# Patient Record
Sex: Male | Born: 1940 | Race: White | Hispanic: No | Marital: Married | Smoking: Former smoker
Health system: Southern US, Community
[De-identification: ages and names within clinical notes are randomized; demographics above are authoritative.]

## PROBLEM LIST (undated history)

## (undated) DIAGNOSIS — C801 Malignant (primary) neoplasm, unspecified: Secondary | ICD-10-CM

## (undated) DIAGNOSIS — E119 Type 2 diabetes mellitus without complications: Secondary | ICD-10-CM

## (undated) DIAGNOSIS — J449 Chronic obstructive pulmonary disease, unspecified: Secondary | ICD-10-CM

## (undated) DIAGNOSIS — I10 Essential (primary) hypertension: Secondary | ICD-10-CM

## (undated) DIAGNOSIS — N289 Disorder of kidney and ureter, unspecified: Secondary | ICD-10-CM

## (undated) DIAGNOSIS — I509 Heart failure, unspecified: Secondary | ICD-10-CM

## (undated) DIAGNOSIS — I4891 Unspecified atrial fibrillation: Secondary | ICD-10-CM

## (undated) DIAGNOSIS — I639 Cerebral infarction, unspecified: Secondary | ICD-10-CM

## (undated) HISTORY — PX: CORONARY ARTERY BYPASS GRAFT: SHX141

---

## 2021-11-13 ENCOUNTER — Emergency Department (HOSPITAL_BASED_OUTPATIENT_CLINIC_OR_DEPARTMENT_OTHER): Payer: Medicare HMO

## 2021-11-13 ENCOUNTER — Emergency Department (HOSPITAL_BASED_OUTPATIENT_CLINIC_OR_DEPARTMENT_OTHER)
Admission: EM | Admit: 2021-11-13 | Discharge: 2021-11-13 | Discharge status: Against Medical Advice | Payer: Medicare HMO | Attending: Emergency Medicine | Admitting: Emergency Medicine

## 2021-11-13 ENCOUNTER — Encounter (HOSPITAL_BASED_OUTPATIENT_CLINIC_OR_DEPARTMENT_OTHER): Payer: Self-pay | Admitting: Urology

## 2021-11-13 DIAGNOSIS — N189 Chronic kidney disease, unspecified: Secondary | ICD-10-CM | POA: Insufficient documentation

## 2021-11-13 DIAGNOSIS — S79922A Unspecified injury of left thigh, initial encounter: Secondary | ICD-10-CM | POA: Diagnosis present

## 2021-11-13 DIAGNOSIS — I509 Heart failure, unspecified: Secondary | ICD-10-CM | POA: Insufficient documentation

## 2021-11-13 DIAGNOSIS — I4891 Unspecified atrial fibrillation: Secondary | ICD-10-CM | POA: Diagnosis not present

## 2021-11-13 DIAGNOSIS — R944 Abnormal results of kidney function studies: Secondary | ICD-10-CM | POA: Insufficient documentation

## 2021-11-13 DIAGNOSIS — R000 Tachycardia, unspecified: Secondary | ICD-10-CM | POA: Insufficient documentation

## 2021-11-13 DIAGNOSIS — S301XXA Contusion of abdominal wall, initial encounter: Secondary | ICD-10-CM | POA: Insufficient documentation

## 2021-11-13 DIAGNOSIS — S7012XA Contusion of left thigh, initial encounter: Secondary | ICD-10-CM | POA: Diagnosis not present

## 2021-11-13 DIAGNOSIS — J449 Chronic obstructive pulmonary disease, unspecified: Secondary | ICD-10-CM | POA: Diagnosis not present

## 2021-11-13 DIAGNOSIS — I130 Hypertensive heart and chronic kidney disease with heart failure and stage 1 through stage 4 chronic kidney disease, or unspecified chronic kidney disease: Secondary | ICD-10-CM | POA: Insufficient documentation

## 2021-11-13 DIAGNOSIS — Y92002 Bathroom of unspecified non-institutional (private) residence single-family (private) house as the place of occurrence of the external cause: Secondary | ICD-10-CM | POA: Insufficient documentation

## 2021-11-13 DIAGNOSIS — Z8673 Personal history of transient ischemic attack (TIA), and cerebral infarction without residual deficits: Secondary | ICD-10-CM | POA: Insufficient documentation

## 2021-11-13 DIAGNOSIS — S40012A Contusion of left shoulder, initial encounter: Secondary | ICD-10-CM | POA: Insufficient documentation

## 2021-11-13 DIAGNOSIS — W010XXA Fall on same level from slipping, tripping and stumbling without subsequent striking against object, initial encounter: Secondary | ICD-10-CM | POA: Insufficient documentation

## 2021-11-13 HISTORY — DX: Malignant (primary) neoplasm, unspecified: C80.1

## 2021-11-13 HISTORY — DX: Chronic obstructive pulmonary disease, unspecified: J44.9

## 2021-11-13 HISTORY — DX: Disorder of kidney and ureter, unspecified: N28.9

## 2021-11-13 HISTORY — DX: Heart failure, unspecified: I50.9

## 2021-11-13 HISTORY — DX: Cerebral infarction, unspecified: I63.9

## 2021-11-13 HISTORY — DX: Type 2 diabetes mellitus without complications: E11.9

## 2021-11-13 HISTORY — DX: Unspecified atrial fibrillation: I48.91

## 2021-11-13 HISTORY — DX: Essential (primary) hypertension: I10

## 2021-11-13 LAB — COMPREHENSIVE METABOLIC PANEL
ALT: 20 U/L (ref 0–44)
AST: 16 U/L (ref 15–41)
Albumin: 3.2 g/dL — ABNORMAL LOW (ref 3.5–5.0)
Alkaline Phosphatase: 87 U/L (ref 38–126)
Anion gap: 8 (ref 5–15)
BUN: 75 mg/dL — ABNORMAL HIGH (ref 8–23)
CO2: 23 mmol/L (ref 22–32)
Calcium: 8.8 mg/dL — ABNORMAL LOW (ref 8.9–10.3)
Chloride: 105 mmol/L (ref 98–111)
Creatinine, Ser: 2.64 mg/dL — ABNORMAL HIGH (ref 0.61–1.24)
GFR, Estimated: 24 mL/min — ABNORMAL LOW (ref >60)
Glucose, Bld: 243 mg/dL — ABNORMAL HIGH (ref 70–99)
Potassium: 5.2 mmol/L — ABNORMAL HIGH (ref 3.5–5.1)
Sodium: 136 mmol/L (ref 135–145)
Total Bilirubin: 0.5 mg/dL (ref 0.3–1.2)
Total Protein: 5.5 g/dL — ABNORMAL LOW (ref 6.5–8.1)

## 2021-11-13 LAB — CBC WITH DIFFERENTIAL/PLATELET
Abs Immature Granulocytes: 0.04 10*3/uL (ref 0.00–0.07)
Basophils Absolute: 0.1 10*3/uL (ref 0.0–0.1)
Basophils Relative: 1 %
Eosinophils Absolute: 0.2 10*3/uL (ref 0.0–0.5)
Eosinophils Relative: 3 %
HCT: 24.8 % — ABNORMAL LOW (ref 39.0–52.0)
Hemoglobin: 8 g/dL — ABNORMAL LOW (ref 13.0–17.0)
Immature Granulocytes: 1 %
Lymphocytes Relative: 12 %
Lymphs Abs: 1 10*3/uL (ref 0.7–4.0)
MCH: 32.1 pg (ref 26.0–34.0)
MCHC: 32.3 g/dL (ref 30.0–36.0)
MCV: 99.6 fL (ref 80.0–100.0)
Monocytes Absolute: 0.9 10*3/uL (ref 0.1–1.0)
Monocytes Relative: 10 %
Neutro Abs: 6.6 10*3/uL (ref 1.7–7.7)
Neutrophils Relative %: 73 %
Platelets: 92 10*3/uL — ABNORMAL LOW (ref 150–400)
RBC: 2.49 MIL/uL — ABNORMAL LOW (ref 4.22–5.81)
RDW: 15 % (ref 11.5–15.5)
WBC: 8.9 10*3/uL (ref 4.0–10.5)
nRBC: 0 % (ref 0.0–0.2)

## 2021-11-13 LAB — PROTIME-INR
INR: 2.8 — ABNORMAL HIGH (ref 0.8–1.2)
Prothrombin Time: 29.4 seconds — ABNORMAL HIGH (ref 11.4–15.2)

## 2021-11-13 MED ORDER — FENTANYL CITRATE PF 50 MCG/ML IJ SOSY
50.0000 ug | PREFILLED_SYRINGE | Freq: Once | INTRAMUSCULAR | Status: AC
  Administered 2021-11-13: 50 ug via INTRAVENOUS
  Filled 2021-11-13: qty 1

## 2021-11-13 MED ORDER — DILTIAZEM HCL 25 MG/5ML IV SOLN
10.0000 mg | Freq: Once | INTRAVENOUS | Status: AC
  Administered 2021-11-13: 10 mg via INTRAVENOUS
  Filled 2021-11-13: qty 5

## 2021-11-13 MED ORDER — SODIUM CHLORIDE 0.9 % IV SOLN: 1000.0000 mL | INTRAVENOUS | Status: DC

## 2021-11-13 MED ORDER — SODIUM CHLORIDE 0.9 % IV BOLUS (SEPSIS)
1000.0000 mL | Freq: Once | INTRAVENOUS | Status: AC
  Administered 2021-11-13: 1000 mL via INTRAVENOUS

## 2021-11-13 NOTE — ED Notes (Signed)
Risks of leaving AMA discussed with the patient and his wife who both verbalized understanding. Follow up care reviewed with the patient and his wife who also verbalized understanding. Encouraged to return to ER for any new or worsening symptoms/condition. Pt wheeled out of ED via wheelchair accompanied by Elberta Fortis NT ?

## 2021-11-13 NOTE — ED Triage Notes (Signed)
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 ?Just finished antibotics for leg cellulitis and has UTI  ?

## 2021-11-13 NOTE — ED Notes (Signed)
Patient transported to CT 

## 2021-11-13 NOTE — ED Provider Notes (Signed)
?[facility] ?Provider Note ? ? ?CSN: [id number] ?Arrival date & time: 11/13/21  1924 ? ?  ? ?History ? ?Chief Complaint  ?Patient presents with  ? Fall  ? ? ?Andrew Reese is a 81 y.o. male. ? ? ?Fall ? ? ?Patient has history of multiple medical problems including diabetes A-fib CHF COPD hypertension renal disorder stroke.  Patient is on chronic anticoagulants Coumadin.  Patient slipped and fell in the bathroom a couple days ago.  He ended up landing on his left side.  Patient went to the primary care doctor yesterday because he started having increasing pain and noticed some bruising on his left thigh and shoulder.  He had x-rays that did not show any signs of fracture.  Patient did not hit his head or lose consciousness.  Today however he has had increasing bruising and swelling noted in his abdomen as well as his left thigh.  He has noted some blood type blisters as well as some oozing of blood from the superficial skin tears.  Patient was diagnosed recently with a UTI.  He is on antibiotics for the ?Patient denies any headache or loss of consciousness from his fall.  No headache or neck pain. ?Home Medications ?Prior to Admission medications   ?Not on File  ?   ? ?Allergies    ?Mushroom extract complex   ? ?Review of Systems   ?Review of Systems  ?Constitutional:  Negative for fever.  ? ?Physical Exam ?Updated Vital Signs ?BP 97/61   Pulse 98   Temp 97.7 ?F (36.5 ?C) (Oral)   Resp 11   Ht 1.753 m ('5\' 9"'$ )   Wt 77.1 kg   SpO2 94%   BMI 25.10 kg/m?  ?Physical Exam ?Vitals and nursing note reviewed.  ?Constitutional:   ?   Appearance: He is well-developed. He is ill-appearing. He is not diaphoretic.  ?HENT:  ?   Head: Normocephalic and atraumatic.  ?   Right Ear: External ear normal.  ?   Left Ear: External ear normal.  ?Eyes:  ?   General: No scleral icterus.    ?   Right eye: No discharge.     ?   Left eye: No discharge.  ?   Conjunctiva/sclera: Conjunctivae normal.  ?Neck:  ?    Trachea: No tracheal deviation.  ?Cardiovascular:  ?   Rate and Rhythm: Tachycardia present. Rhythm irregular.  ?Pulmonary:  ?   Effort: Pulmonary effort is normal. No respiratory distress.  ?   Breath sounds: Normal breath sounds. No stridor. No wheezing or rales.  ?Abdominal:  ?   General: Bowel sounds are normal. There is no distension.  ?   Palpations: Abdomen is soft.  ?   Tenderness: There is abdominal tenderness. There is no guarding or rebound.  ?   Comments: Bruising noted left lower abdomen  ?Musculoskeletal:     ?   General: Swelling and tenderness present. No deformity.  ?   Cervical back: Neck supple.  ?   Comments: Large hematoma encompassing the left thigh as well as left upper abdomen and left hemiscrotum area, superficial skin tears with some oozing of serosanguineous fluid  ?Skin: ?   General: Skin is warm and dry.  ?   Findings: No rash.  ?Neurological:  ?   General: No focal deficit present.  ?   Mental Status: He is alert.  ?   Cranial Nerves: No cranial nerve deficit (no facial droop, extraocular movements intact, no slurred  speech).  ?   Sensory: No sensory deficit.  ?   Motor: No abnormal muscle tone or seizure activity.  ?   Coordination: Coordination normal.  ?Psychiatric:     ?   Mood and Affect: Mood normal.  ? ? ? ?ED Results / Procedures / Treatments   ?Labs ?(all labs ordered are listed, but only abnormal results are displayed) ?Labs Reviewed  ?CBC WITH DIFFERENTIAL/PLATELET - Abnormal; Notable for the following components:  ?    Result Value  ? RBC 2.49 (*)   ? Hemoglobin 8.0 (*)   ? HCT 24.8 (*)   ? Platelets 92 (*)   ? All other components within normal limits  ?COMPREHENSIVE METABOLIC PANEL - Abnormal; Notable for the following components:  ? Potassium 5.2 (*)   ? Glucose, Bld 243 (*)   ? BUN 75 (*)   ? Creatinine, Ser 2.64 (*)   ? Calcium 8.8 (*)   ? Total Protein 5.5 (*)   ? Albumin 3.2 (*)   ? GFR, Estimated 24 (*)   ? All other components within normal limits  ?PROTIME-INR -  Abnormal; Notable for the following components:  ? Prothrombin Time 29.4 (*)   ? INR 2.8 (*)   ? All other components within normal limits  ?PATHOLOGIST SMEAR REVIEW  ? ? ?EKG ?EKG Interpretation ? ?Date/Time:  Thursday November 13 2021 20:43:56 EDT ?Ventricular Rate:  94 ?PR Interval:    ?QRS Duration: 149 ?QT Interval:  367 ?QTC Calculation: 439 ?R Axis:   111 ?Text Interpretation: Atrial fibrillation Right bundle branch block Since last tracing rate slower Confirmed by Dorie Rank [phone]) on 11/13/2021 8:53:22 PM ? ?Radiology ?CT ABDOMEN PELVIS WO CONTRAST ? ?Result Date: 11/13/2021 ?CLINICAL DATA:  Trauma/fall, bruising, on blood thinners EXAM: CT ABDOMEN AND PELVIS WITHOUT CONTRAST TECHNIQUE: Multidetector CT imaging of the abdomen and pelvis was performed following the standard protocol without IV contrast. RADIATION DOSE REDUCTION: This exam was performed according to the departmental dose-optimization program which includes automated exposure control, adjustment of the mA and/or kV according to patient size and/or use of iterative reconstruction technique. 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 Hepatobiliary: Unenhanced liver is unremarkable. Layering tiny gallstones (series 2/image 27), without associated inflammatory changes. Pancreas: Within normal limits. Spleen: Calcified splenic granuloma. Adrenals/Urinary Tract: Adrenal glands are within normal limits. Mild right renal atrophy. Left kidney is within normal limits. No renal calculi or hydronephrosis. Bladder is mildly thick-walled although underdistended. Stomach/Bowel: Stomach is within normal limits. No evidence of bowel obstruction. Normal appendix (series 2/image 45). Left colonic diverticulosis, without evidence of diverticulitis. Vascular/Lymphatic: Aortic stent. Bilateral iliac grafts. Postsurgical changes in the left groin. Atherosclerotic calcifications. No suspicious abdominopelvic  lymphadenopathy. Reproductive: Prostate is grossly unremarkable. Other: No abdominopelvic ascites. Musculoskeletal: 3.8 x 9.8 cm hematoma along the left lateral hip/thigh (series 2/image 95), incompletely visualized but measuring at least 10.5 cm in craniocaudal dimension (coronal image 89). Additional subcutaneous stranding (series 2/image 67). Mild degenerative changes of the visualized thoracolumbar spine. No fracture is seen. IMPRESSION: 10.5 cm hematoma along the left lateral hip/thigh, incompletely visualized. No fracture is seen. Additional ancillary findings as above. Electronically Signed   By: Julian Hy M.D.   On: 11/13/2021 21:23  ? ?DG Chest Portable 1 View ? ?Result Date: 11/13/2021 ?CLINICAL DATA:  Fall, chest injury EXAM: PORTABLE CHEST 1 VIEW COMPARISON:  11/11/2021 FINDINGS: The lungs are hyperinflated in keeping with changes of underlying COPD. The lungs are  clear. No pneumothorax or pleural effusion. Coronary artery bypass grafting has been performed. Cardiac size within normal limits. Left subclavian single lead pacemaker is unchanged. Pulmonary vascularity is normal. No acute bone abnormality. IMPRESSION: No active disease.  COPD. Electronically Signed   By: Fidela Salisbury M.D.   On: 11/13/2021 20:56  ? ?DG Hip Unilat W or Wo Pelvis 2-3 Views Left ? ?Result Date: 11/13/2021 ?CLINICAL DATA:  Fall, left hip injury EXAM: DG HIP (WITH OR WITHOUT PELVIS) 2-3V LEFT COMPARISON:  11/11/2021 FINDINGS: Normal alignment. No acute fracture or dislocation. Joint spaces are preserved. There is infiltration of the subcutaneous fat lateral to the greater trochanter of the left hip in keeping with the given history of left hip bruising. Vascular calcifications are noted within the medial thighs bilaterally with vascular stents in place within the superficial femoral arteries. IMPRESSION: No acute fracture or dislocation. Soft tissue swelling lateral to the left hip. Electronically Signed   By: Fidela Salisbury M.D.   On: 11/13/2021 20:53   ? ?Procedures ?Procedures  ? ? ?Medications Ordered in ED ?Medications  ?sodium chloride 0.9 % bolus 1,000 mL (1,000 mLs Intravenous New Bag/Given 11/13/21 2043)  ?  Followed by  ?0.

## 2021-11-13 NOTE — ED Notes (Signed)
Pt has returned from CT.  

## 2021-11-13 NOTE — ED Notes (Signed)
Wound care performed to posterior upper left leg, Mepitel applied after bacitrcin ointment. Telfa applied and wrapped with Tube gauze. Pt tolerated well.  ?

## 2021-11-13 NOTE — Discharge Instructions (Signed)
Please return to the hospital if you change your mind about being admitted.  As we discussed, your blood count is low and your kidney function is abnormal.  I do not have any old test for comparison.  CT scan did show a very large hematoma in your leg.  I would recommend not taking your Coumadin for the next few days because of your extensive bruising.  Follow-up with your doctor to be rechecked. ?

## 2021-11-13 NOTE — ED Notes (Signed)
Dressing applied by Evelena Peat, RN ?

## 2021-11-17 LAB — PATHOLOGIST SMEAR REVIEW

## 2021-12-18 DEATH — deceased

## 2023-05-12 IMAGING — DX DG HIP (WITH OR WITHOUT PELVIS) 2-3V*L*
4 series · 4 of 4 positions shown · non-contrast
Comparison: 11/11/2021

CLINICAL DATA: Fall, left hip injury

EXAM:
DG HIP (WITH OR WITHOUT PELVIS) 2-3V LEFT

[pelvis ap (1 of 2)]
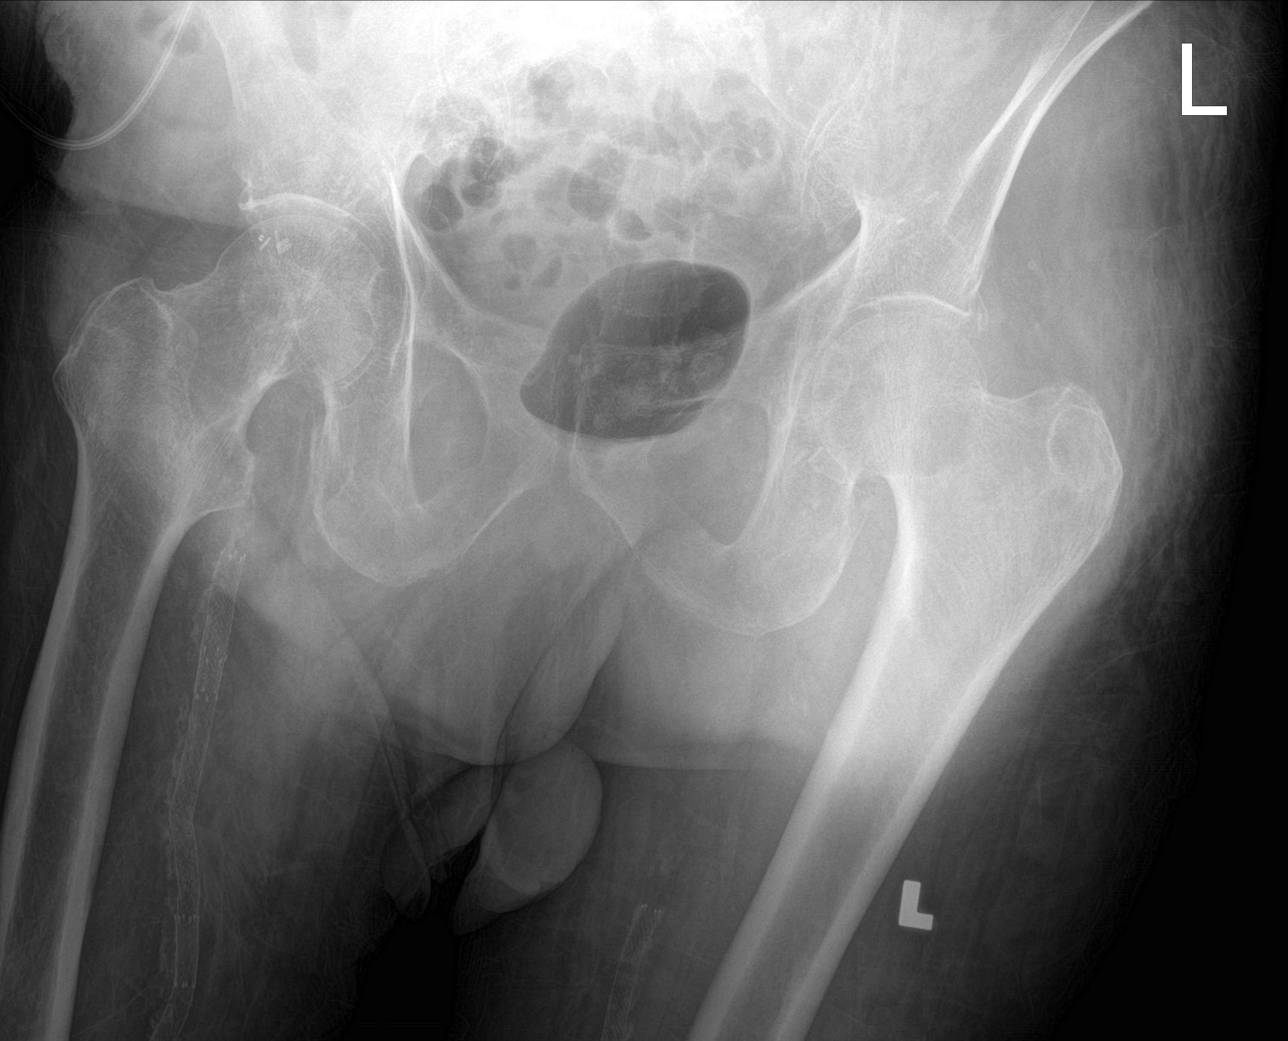

[hip ap]
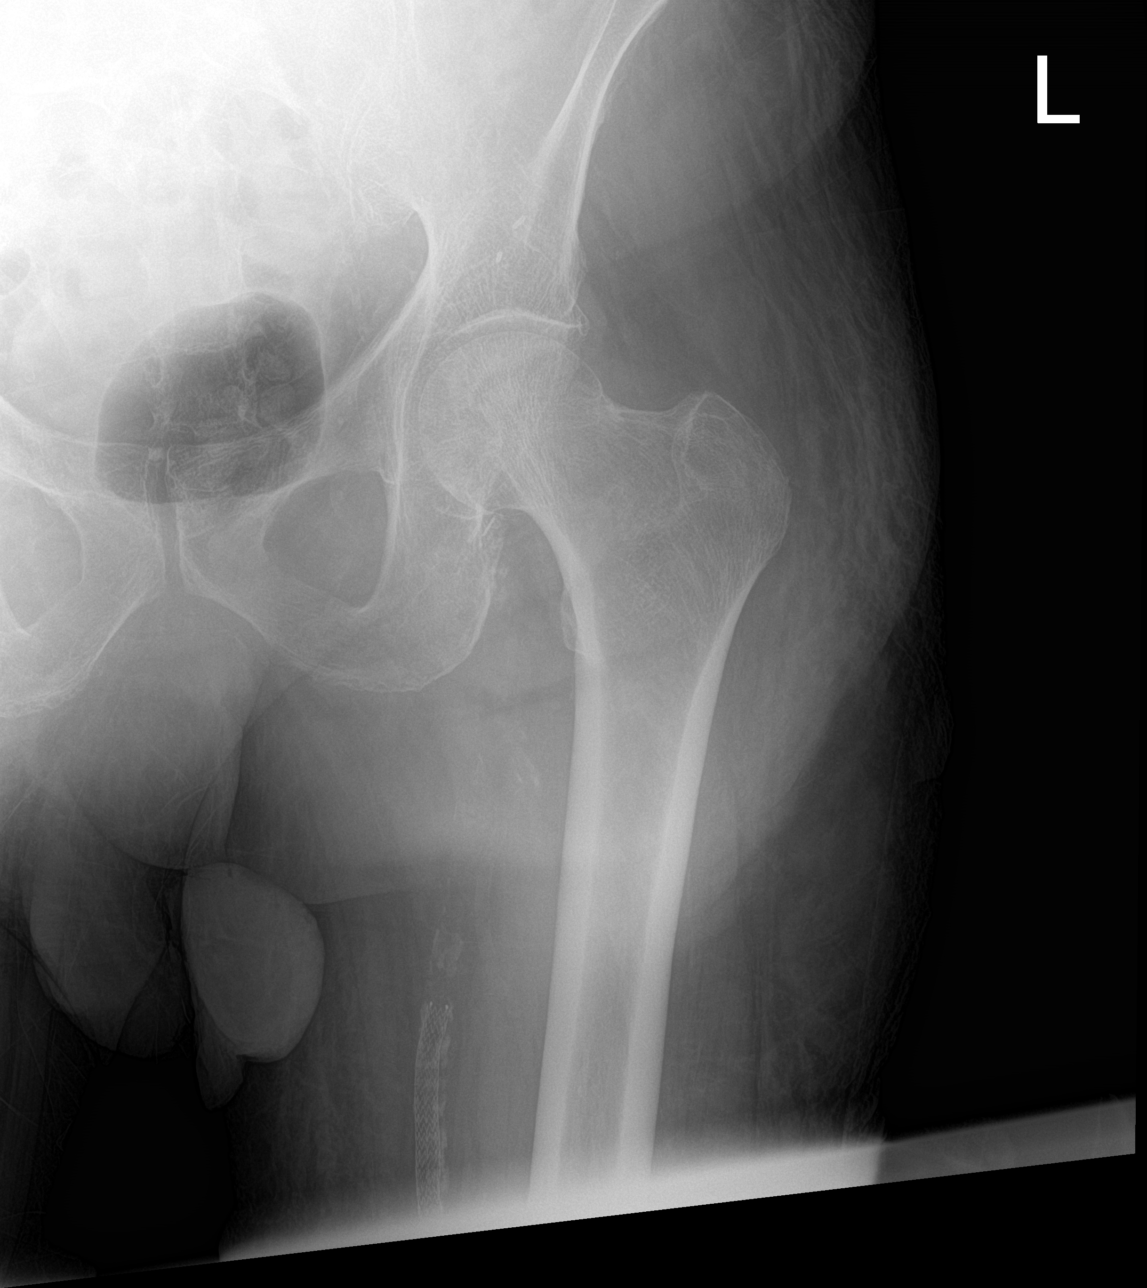

[hip lat]
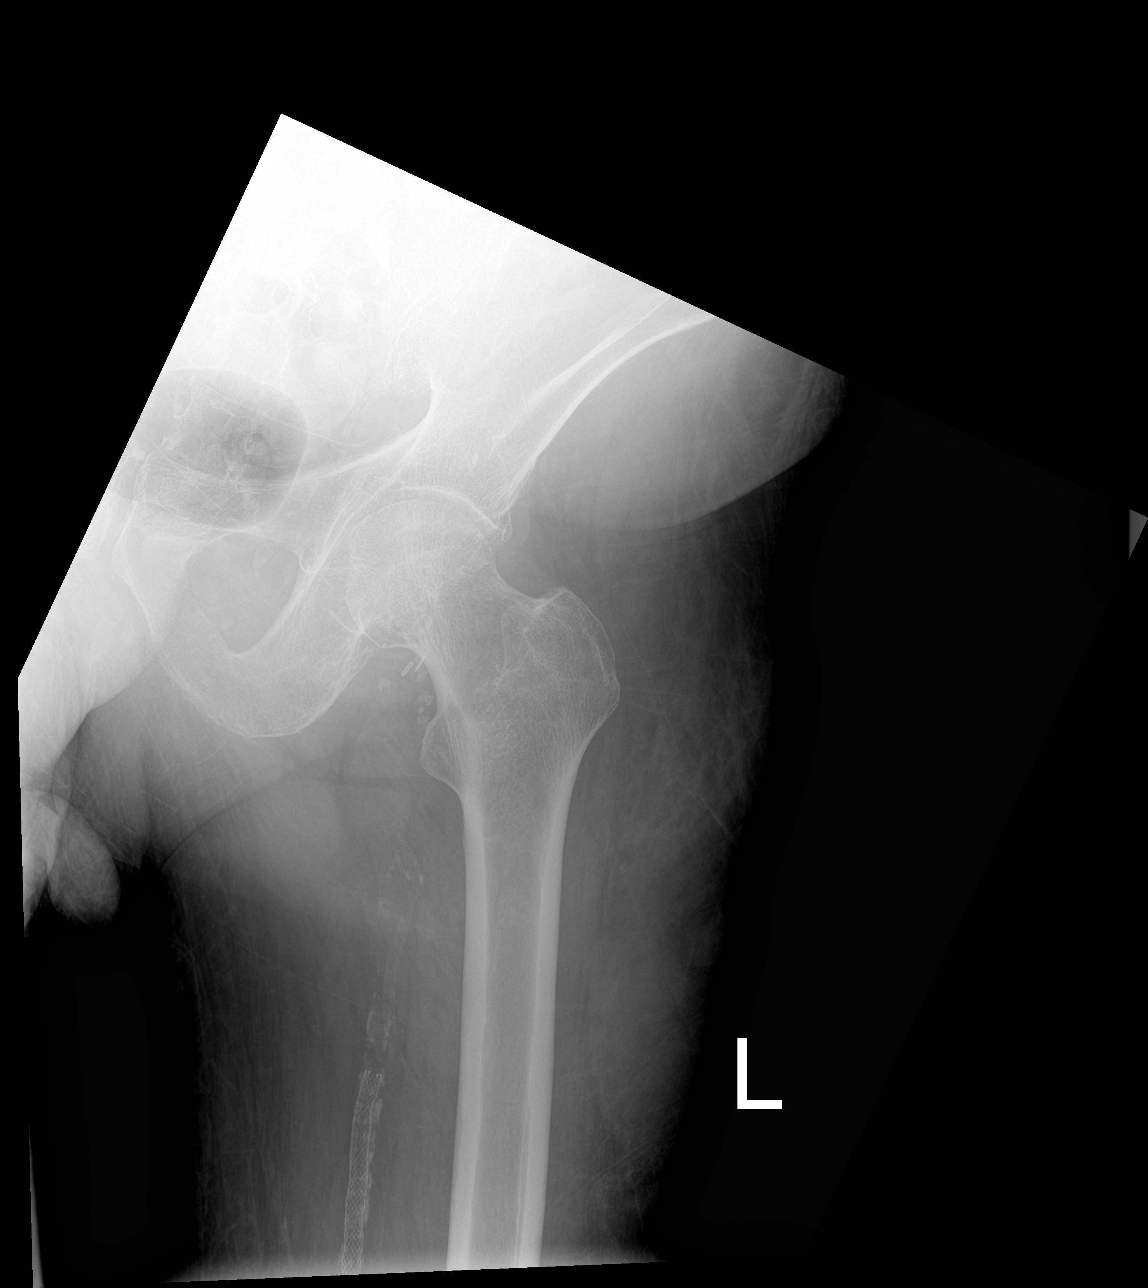

[pelvis ap (2 of 2)]
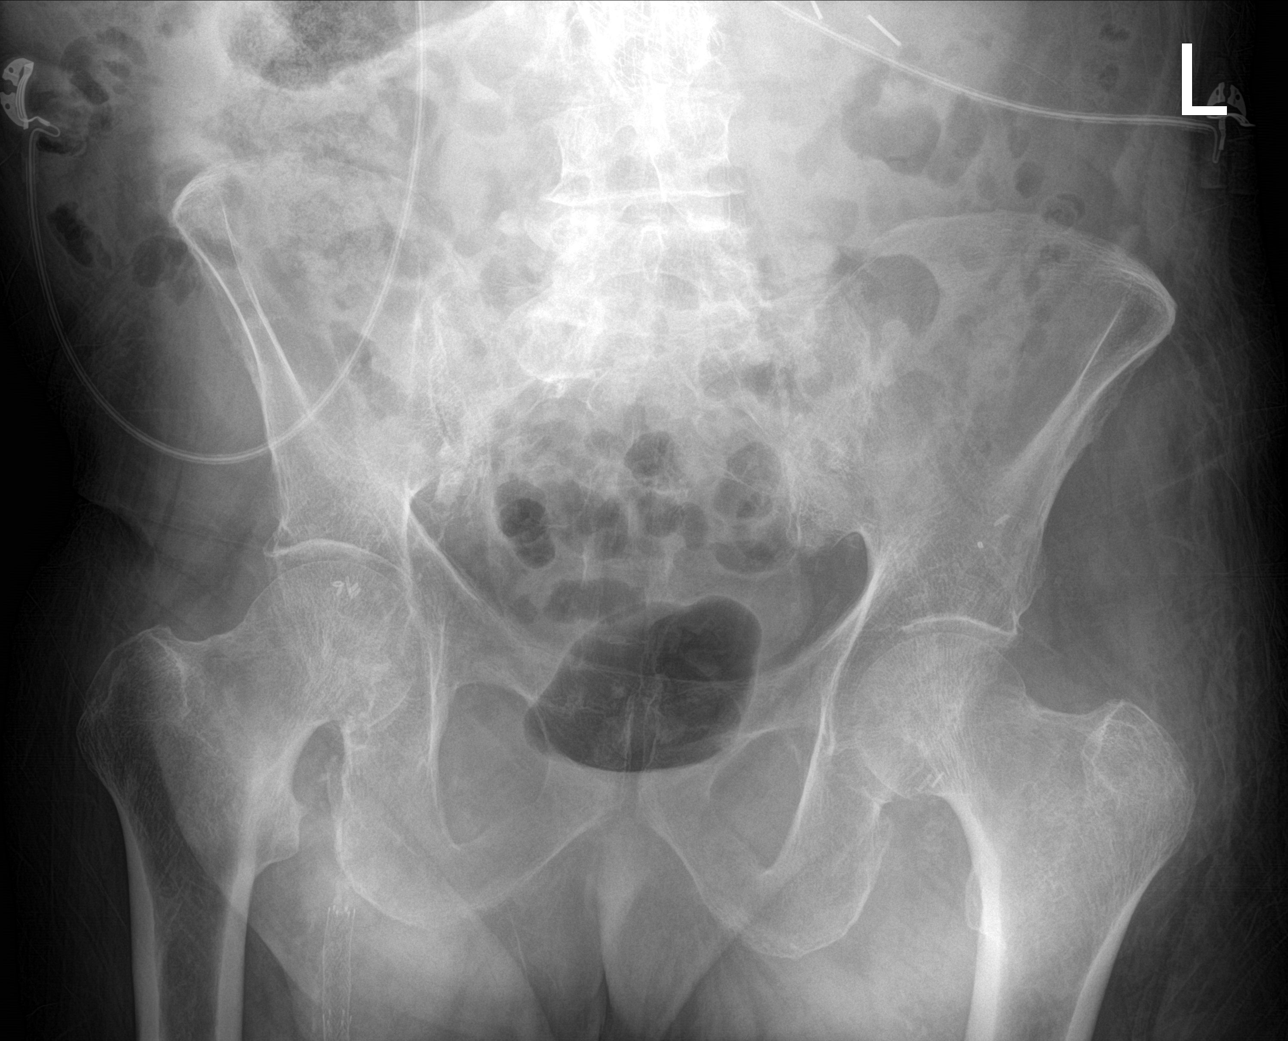

[4 of 4 positions shown; findings below may reference images not displayed]

FINDINGS: Normal alignment. No acute fracture or dislocation. Joint spaces are
preserved. There is infiltration of the subcutaneous fat lateral to
the greater trochanter of the left hip in keeping with the given
history of left hip bruising. Vascular calcifications are noted
within the medial thighs bilaterally with vascular stents in place
within the superficial femoral arteries.
IMPRESSION: No acute fracture or dislocation.

Soft tissue swelling lateral to the left hip.

## 2023-05-12 IMAGING — CT CT ABD-PELV W/O CM
2 of 4 series · 15 of 46 positions shown, 17 images · non-contrast
Comparison: PET-CT dated 01/10/2019

CLINICAL DATA: Trauma/fall, bruising, on blood thinners



[Series 2: axial st · axial · 0.97mm/px · z∈[+791,+1261]mm · 12 of 104 slices shown, 14 images]
[im 5/104  soft-tissue]
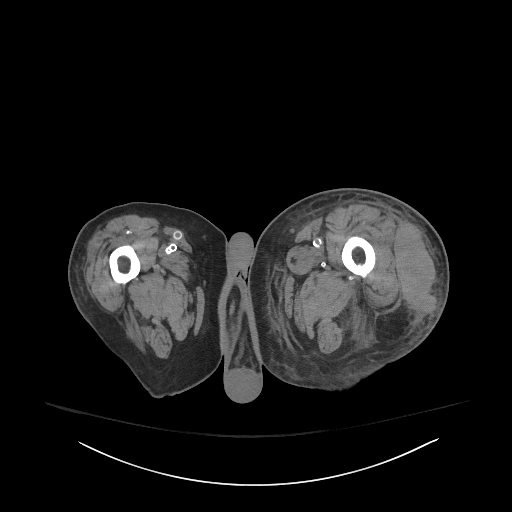
[im 5/104  bone]
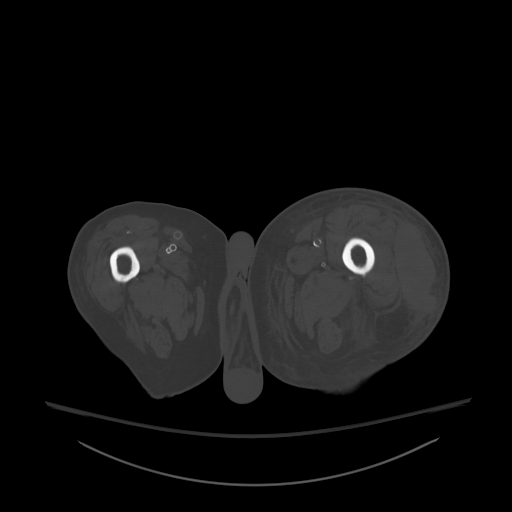
[im 13/104  soft-tissue]
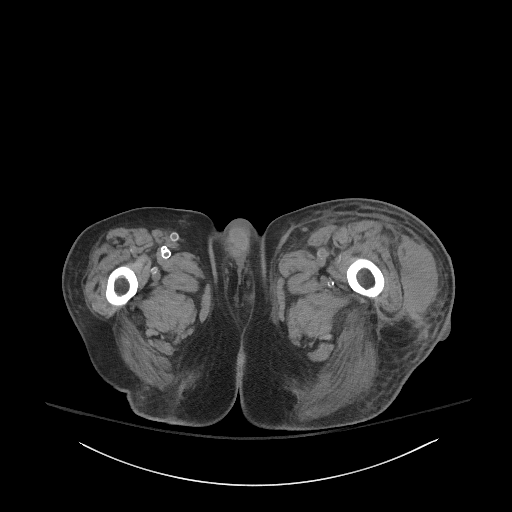
[im 22/104  soft-tissue]
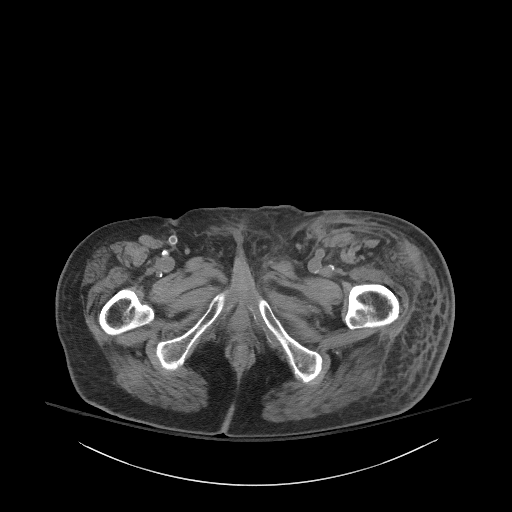
[im 31/104  soft-tissue]
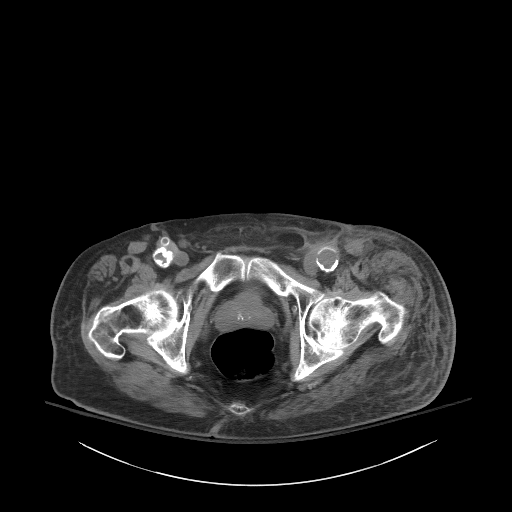
[im 39/104  soft-tissue]
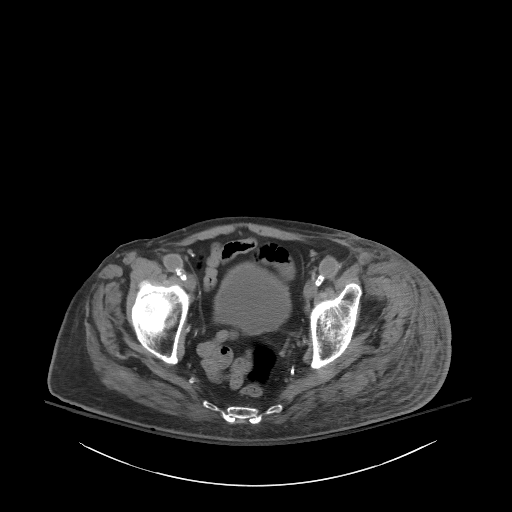
[im 48/104  soft-tissue]
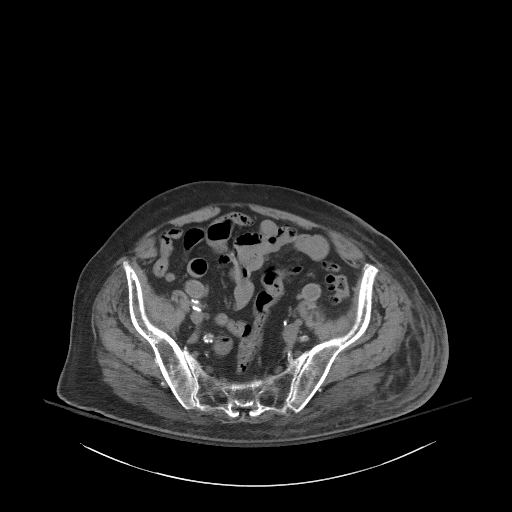
[im 56/104  soft-tissue]
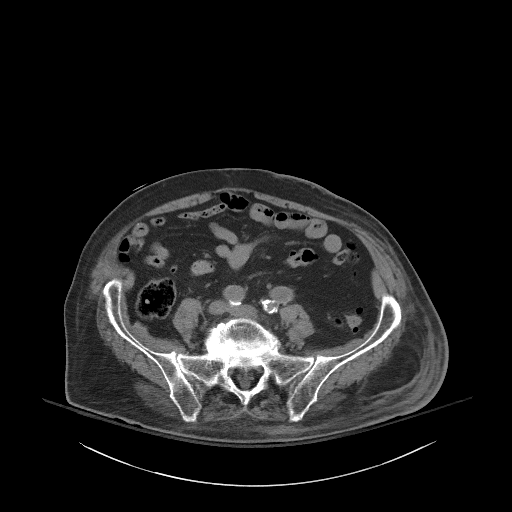
[im 65/104  soft-tissue]
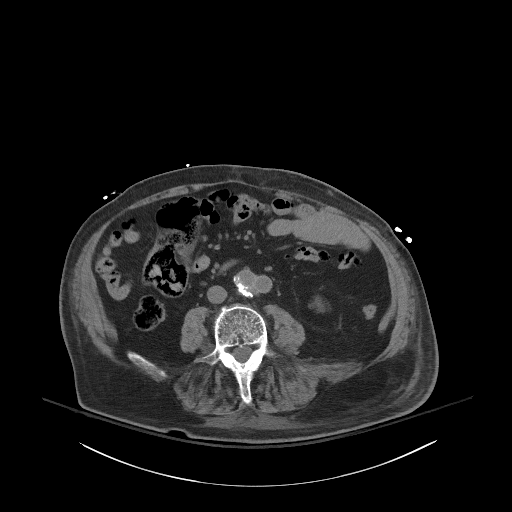
[im 73/104  soft-tissue]
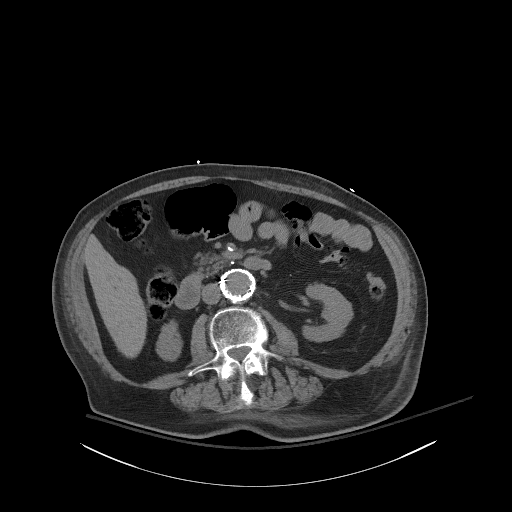
[im 73/104  bone]
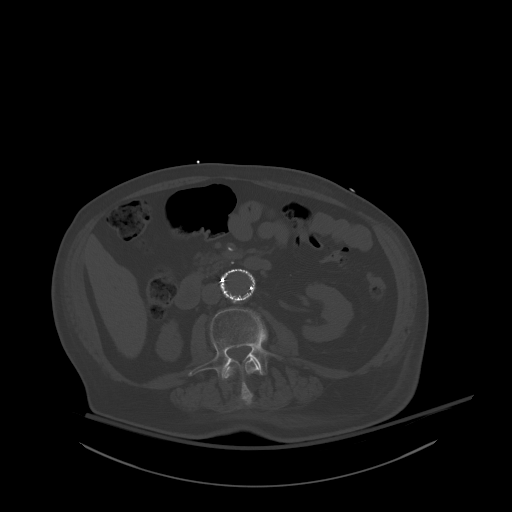
[im 82/104  soft-tissue]
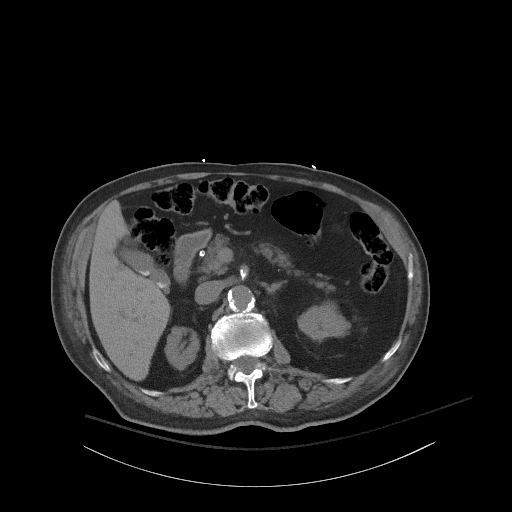
[im 91/104  soft-tissue]
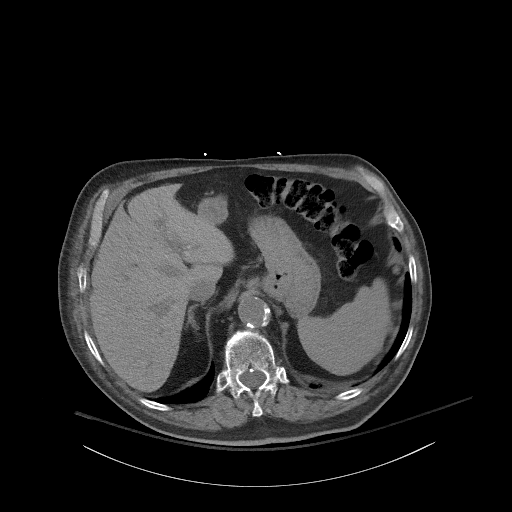
[im 99/104  soft-tissue]
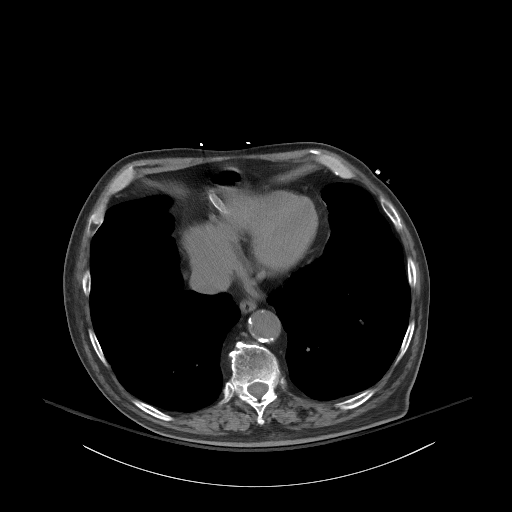

[Series 6: coronal st · coronal · 0.74mm/px · 3 of 101 slices shown]
[im 34/101  soft-tissue]
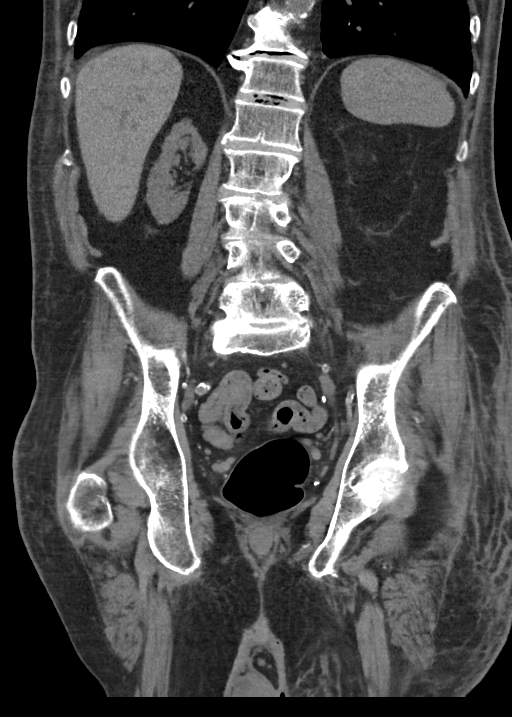
[im 45/101  soft-tissue]
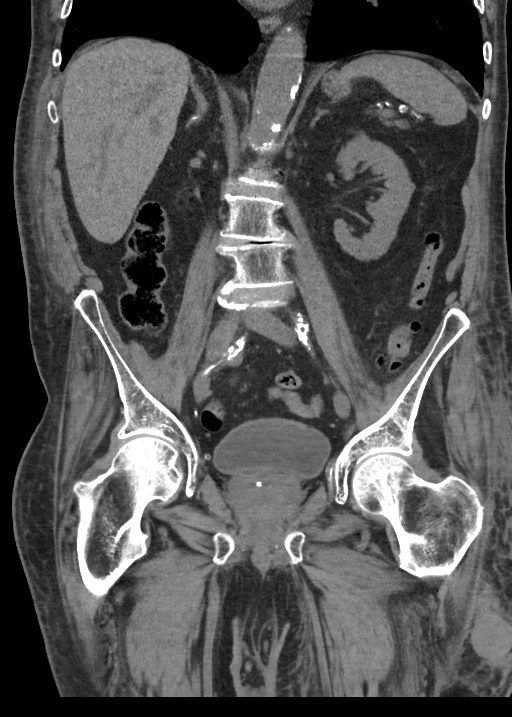
[im 56/101  soft-tissue]
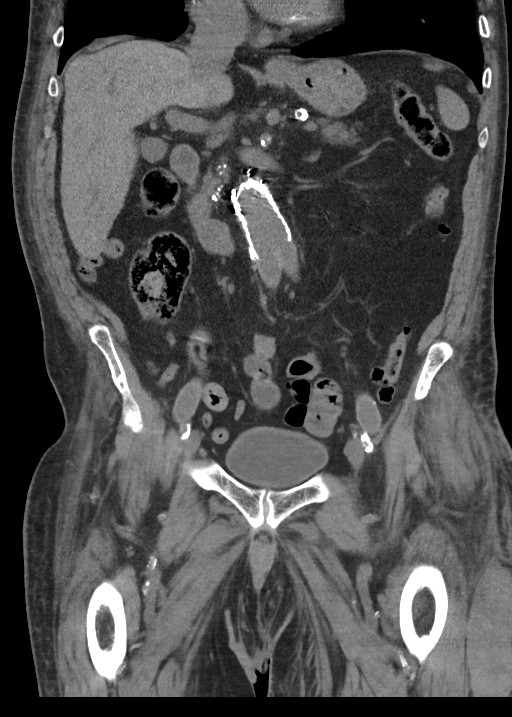

[15 of 46 positions shown; findings below may reference images not displayed]

FINDINGS: Lower chest: Emphysematous changes at the lung bases.

Hepatobiliary: Unenhanced liver is unremarkable.

Layering tiny gallstones (series 2/image 27), without associated
inflammatory changes.

Pancreas: Within normal limits.

Spleen: Calcified splenic granuloma.

Adrenals/Urinary Tract: Adrenal glands are within normal limits.

Mild right renal atrophy. Left kidney is within normal limits. No
renal calculi or hydronephrosis.

Bladder is mildly thick-walled although underdistended.

Stomach/Bowel: Stomach is within normal limits.

No evidence of bowel obstruction.

Normal appendix (series 2/image 45).

Left colonic diverticulosis, without evidence of diverticulitis.

Vascular/Lymphatic: Aortic stent. Bilateral iliac grafts.
Postsurgical changes in the left groin. Atherosclerotic
calcifications.

No suspicious abdominopelvic lymphadenopathy.

Reproductive: Prostate is grossly unremarkable.

Other: No abdominopelvic ascites.

Musculoskeletal: 3.8 x 9.8 cm hematoma along the left lateral
hip/thigh (series 2/image 95), incompletely visualized but measuring
at least 10.5 cm in craniocaudal dimension (coronal image 89).
Additional subcutaneous stranding (series 2/image 67).

Mild degenerative changes of the visualized thoracolumbar spine. No
fracture is seen.
IMPRESSION: 10.5 cm hematoma along the left lateral hip/thigh, incompletely
visualized.

No fracture is seen.

Additional ancillary findings as above.
